# Patient Record
Sex: Male | Born: 1960 | Race: White | Hispanic: No | Marital: Married | Smoking: Light tobacco smoker
Health system: Southern US, Community
[De-identification: ages and names within clinical notes are randomized; demographics above are authoritative.]

---

## 2006-03-03 ENCOUNTER — Ambulatory Visit: Payer: Self-pay | Admitting: Gastroenterology

## 2007-11-26 ENCOUNTER — Ambulatory Visit (HOSPITAL_COMMUNITY): Admission: RE | Admit: 2007-11-26 | Discharge: 2007-11-26 | Payer: Self-pay | Admitting: *Deleted

## 2007-11-26 ENCOUNTER — Encounter (INDEPENDENT_AMBULATORY_CARE_PROVIDER_SITE_OTHER): Payer: Self-pay | Admitting: *Deleted

## 2010-11-30 NOTE — Op Note (Signed)
NAME:  Joe Ferguson, WIEN NO.:  [id number]   MEDICAL RECORD NO.:  [id number]          PATIENT TYPE:  AMB   LOCATION:  ENDO                         FACILITY:  [facility]   PHYSICIAN:  Georgiana Spinner, M.D.    DATE OF BIRTH:  11/19/1960   DATE OF PROCEDURE:  11/26/2007  DATE OF DISCHARGE:                               OPERATIVE REPORT   PROCEDURE:  Upper endoscopy.   INDICATIONS:  GERD with dysphagia.   ANESTHESIA:  Fentanyl 62.5 mcg, Versed 5 mg.   DESCRIPTION OF PROCEDURE:  With the patient mildly sedated in the left  lateral decubitus position, the Pentax videoscopic endoscope was  inserted in the mouth, passed under direct vision through the esophagus  which appeared normal until we reached the distal esophagus and there  was clear-cut area of Barrett's photographed and biopsied.  There were  also some changes of esophagitis with ulceration noted in two locations.  We entered into the stomach.  Fundus, body, antrum, duodenal bulb,  second portion duodenum were visualized.  From this point the endoscope  was slowly withdrawn taking circumferential views of duodenal mucosa  until the endoscope had been pulled back into the stomach, placed in  retroflexion to view the stomach from below.  The endoscope was  straightened and withdrawn taking circumferential views of remaining  gastric and esophageal mucosa.  The patient's vital signs and pulse  oximeter remained stable.  The patient tolerated the procedure well  without apparent complications.   FINDINGS:  Changes of esophagitis and Barrett's esophagus of the distal  esophagus, biopsied.  Await biopsy report.  The patient will call me for  results.  Will start the patient on PPI therapy and he will follow-up  with me as an outpatient.           ______________________________  Georgiana Spinner, M.D.     GMO/MEDQ  D:  11/26/2007  T:  11/26/2007  Job:  [id number]

## 2010-11-30 NOTE — Op Note (Signed)
NAME:  Joe Ferguson, CHRISTMAS NO.:  [id number]   MEDICAL RECORD NO.:  [id number]          PATIENT TYPE:  AMB   LOCATION:  ENDO                         FACILITY:  [facility]   PHYSICIAN:  Georgiana Spinner, M.D.    DATE OF BIRTH:  [birth year]   DATE OF PROCEDURE:  DATE OF DISCHARGE:                               OPERATIVE REPORT   PROCEDURE:  Colonoscopy.   INDICATIONS:  Colon cancer screening.  Family history of colon cancer.   ANESTHESIA:  Fentanyl 37.5 mcg, Versed 3 mg.   DESCRIPTION OF PROCEDURE:  With the patient mildly sedated in the left  lateral decubitus position a rectal examination was performed, which was  unremarkable to my exam.  Subsequently, the Pentax videoscopic  colonoscope was inserted in the rectum and passed under direct vision  with pressure applied to reach the cecum.  The cecum was identified by  the ileocecal valve.  We could never really see the base of the cecum  because the prep was poor.  There was liquid material with solid, what  appeared to be corn in the cecum which I could not fully suctioned.  From this point, the colonoscope was then withdrawn, taking  circumferential views of the colonic mucosa, stopping to suction fecal  debris that was liquid with solid materials along the way until we  reached the rectum which appeared normal on direct and showed  hemorrhoids on retroflexed view.  The endoscope was straightened and  withdrawn.  The patient's vital signs and pulse oximeter remained  stable.  The patient tolerated the procedure well without apparent  complications.   FINDINGS:  Poor prep precludes thorough examination.  No gross lesions  seen.  Some areas not well seen.  Internal hemorrhoids were noted.   PLAN:  Repeat examination in 1 year.           ______________________________  Georgiana Spinner, M.D.     GMO/MEDQ  D:  11/26/2007  T:  11/26/2007  Job:  [id number]

## 2010-12-03 NOTE — Assessment & Plan Note (Signed)
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 Several times during the day for the past few  months.  He has had no change in bowel habits, weight loss, abdominal pain,  change in stool caliber, melena, hematochezia or rectal pain.  His father  has a history of colon cancer arising in a polyp. No other family members  with colon cancer, colon polyps or inflammatory bowel disease.  He states  home stool Hemoccult tests performed within the past few months were  negative. He has no previously had a colonoscopy.   PAST MEDICAL HISTORY:  Status post shoulder surgery in 1998.   CURRENT MEDICATIONS:  Listed on the chart, reviewed.   ALLERGIES:  No known drug allergies.   SOCIAL HISTORY:  He is divorced with one child. 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 He smokes about 5 cigarettes per day and drinks a  modest amount of alcohol on social occasions.   REVIEW OF SYSTEMS:  Entirely negative per the handwritten form.   PHYSICAL EXAMINATION:  GENERAL:  In no acute distress.  VITAL SIGNS:  Height 5 feet, 7 inches. Weight 174.2 pounds. Blood pressure  is 132/84, pulse 60 and regular.  HEENT EXAM:  Anicteric sclerae. Oropharynx clear.  CHEST:  Clear to auscultation bilaterally.  CARDIAC:  Regular rate and rhythm without murmurs appreciated.  ABDOMEN:  Soft, nontender, nondistended, normal active bowel sounds, no  palpable organomegaly, masses or hernias.  RECTAL EXAMINATION:  Deferred until time of  colonoscopy.  EXTREMITIES:  Without clubbing, cyanosis or edema.  NEUROLOGIC:  Alert and oriented x3. Grossly nonfocal.   ASSESSMENT AND PLAN:  First degree relative with colon cancer in his 72's.  Rule out colorectal neoplasms. Risks, benefits and alternatives to  colonoscopy with possible biopsy and  possible polypectomy discussed with the patient. He consents to proceed.  This will be scheduled electively.                                   Venita Lick. Pleas Koch., MD, Clementeen Graham   MTS/MedQ  DD:  03/10/2006  DT:  03/10/2006  Job #:  [id number]

## 2010-12-27 ENCOUNTER — Other Ambulatory Visit: Payer: Self-pay | Admitting: Orthopedic Surgery

## 2010-12-27 DIAGNOSIS — M25511 Pain in right shoulder: Secondary | ICD-10-CM

## 2011-01-03 ENCOUNTER — Ambulatory Visit
Admission: RE | Admit: 2011-01-03 | Discharge: 2011-01-03 | Disposition: A | Discharge status: Home / Self Care | Payer: PRIVATE HEALTH INSURANCE | Source: Ambulatory Visit | Attending: Orthopedic Surgery | Admitting: Orthopedic Surgery

## 2011-01-03 ENCOUNTER — Other Ambulatory Visit: Payer: Self-pay | Admitting: Orthopedic Surgery

## 2011-01-03 DIAGNOSIS — M25512 Pain in left shoulder: Secondary | ICD-10-CM

## 2011-01-03 DIAGNOSIS — M25511 Pain in right shoulder: Secondary | ICD-10-CM

## 2011-01-24 ENCOUNTER — Ambulatory Visit (HOSPITAL_BASED_OUTPATIENT_CLINIC_OR_DEPARTMENT_OTHER)
Admission: RE | Admit: 2011-01-24 | Discharge: 2011-01-24 | Disposition: A | Discharge status: Home / Self Care | Payer: PRIVATE HEALTH INSURANCE | Source: Ambulatory Visit | Attending: Orthopedic Surgery | Admitting: Orthopedic Surgery

## 2011-01-24 DIAGNOSIS — Z01812 Encounter for preprocedural laboratory examination: Secondary | ICD-10-CM | POA: Insufficient documentation

## 2011-01-24 DIAGNOSIS — S43429A Sprain of unspecified rotator cuff capsule, initial encounter: Secondary | ICD-10-CM | POA: Insufficient documentation

## 2011-01-24 DIAGNOSIS — M24119 Other articular cartilage disorders, unspecified shoulder: Secondary | ICD-10-CM | POA: Insufficient documentation

## 2011-01-24 DIAGNOSIS — Z87891 Personal history of nicotine dependence: Secondary | ICD-10-CM | POA: Insufficient documentation

## 2011-01-24 DIAGNOSIS — W19XXXA Unspecified fall, initial encounter: Secondary | ICD-10-CM | POA: Insufficient documentation

## 2011-01-24 DIAGNOSIS — M25819 Other specified joint disorders, unspecified shoulder: Secondary | ICD-10-CM | POA: Insufficient documentation

## 2011-02-08 NOTE — Op Note (Signed)
NAME:  Joe Ferguson, Joe Ferguson NO.:  [id number]  MEDICAL RECORD NO.:  [id number]  LOCATION:                                 FACILITY:  PHYSICIAN:  Jones Broom, MD         DATE OF BIRTH:  DATE OF PROCEDURE:  01/24/2011 DATE OF DISCHARGE:                              OPERATIVE REPORT   PREOPERATIVE DIAGNOSES: 1. Left shoulder rotator cuff tear. 2. Left shoulder impingement.  POSTOPERATIVE DIAGNOSES: 1. Left shoulder supraspinatus tear. 2. Left shoulder impingement. 3. Left shoulder superior and anterior labral tears.  PROCEDURES PERFORMED: 1. Left arthroscopic rotator cuff repair of the supraspinatus tendon. 2. Left arthroscopic subacromial decompression. 3. Left arthroscopic debridement of superior and anterior labral     tears.  ATTENDING SURGEON:  Jones Broom, MD  ASSISTANT:  None.  ANESTHESIA:  GETA with preoperative interscalene block.  COMPLICATIONS:  None.  DRAINS:  None. SPECIMENS:  None.  ESTIMATED BLOOD LOSS:  Minimal.  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  He complained of left shoulder pain and weakness.  He was unable to get an MRI due to claustrophobia, but an ultrasound demonstrated findings of full- thickness rotator cuff tear.  He was indicated for operative treatment to restore strength and function and decrease pain as well as to try and prevent increase in tear size with time.  He understood risks, benefits, and alternatives of the procedure including but not limited to risk of bleeding, infection, damage to neurovascular structures, stiffness, and nonhealing of the tendon.  He understood and elected to go forward with surgery.  OPERATIVE FINDINGS:  Examination under anesthesia demonstrated no laxity or instability.  Diagnostic arthroscopy revealed extensive tearing of the superior labrum with no significant elevation of the biceps  root, but the torn labrum was hanging down into the joint.  The biceps tendon itself was healthy appearing as was the subscapularis.  The anterior labrum was partially torn.  The joint surfaces looked good with no significant arthritis.  There was some synovitis throughout the joint. No loose bodies were noted.  The infraspinatus was intact with some partial-thickness undersurface tearing, but no full-thickness tearing. There was full-thickness tear of the supraspinatus with posterior retraction of the tendon.  The labral tears were debrided back to stable base.  There was extensive hypertrophied bursa in the subacromial space which was debrided.  Once the tear was exposed, it was found to be anterior L-shaped tear retracted posteriorly.  After some minimal releases, the tendon was easily brought back to its anatomic position. It was then repaired using two 5.5 mm BioComposite corkscrew anchors with the posterior anchor in a mattress configuration and the anterior anchor in a simple suture configuration bringing the corner back to the anterior tuberosity.  The repair was felt to be anatomic and under no undue tension.  The coracoacromial ligament was taken down and he was noted to have a moderate-sized anterior acromial spur, which was taken down with a standard acromioplasty.  PROCEDURE:  The patient was identified in the preoperative holding area where I personally marked  the operative site after verifying site, side, and procedure with the patient.  He was taken back to the operating room after interscalene block that was given by the attending anesthesiologist successfully.  He was given general anesthesia and placed in the beach-chair position and all extremities were carefully padded and positioned as well as the head and neck.  The patient did receive IV antibiotics prior to the procedure.  The appropriate time-out procedure was carried out.  The left upper extremity was prepped  and draped in a standard sterile fashion and a standard posterior portal was established.  The arthroscope was introduced into the joint and the anterior portal was then established with needle localization above the subscapularis.  Diagnostic arthroscopy was then carried out with findings as described above.  The shaver was introduced through the anterior portal to debride the anterior and superior labral tears back to a stable base.  The residual labrum was healthy appearing.  The rotator cuff tear was visualized as described above and the undersurface of the scarred tendon edge was debrided from the joint surface.  The arthroscope was then introduced in the subacromial space where he was noted to have extensive hypertrophied bursa which was debrided using the ArthroCare and shaver.  Once the rotator cuff was clearly demarcated on the bursal surface, the tear configuration was noted to be a L-shaped tear which had retracted medially and posteriorly.  After some small releases in the subacromial subdeltoid space posteriorly, the tendon was easily mobilized back to its anatomical origin.  There was noted to besome residual tendon on the far lateral edge of the tuberosity and after this tendon was debrided to expose the tuberosity for repair.  Given the configuration of the repair, I placed one posterior 5.5-mm BioComposite corkscrew anchor at the articular margin passing one suture in a horizontal mattress configuration in the posterior aspect of the tendon. I was able to place these sutures by first passing a traction stitch which was brought out percutaneously to hold the tendon reduced while passing the sutures with the Scorpion suture passer.  The second anchor was then placed off the articular margin in the anterior corner of the repair and each suture was then passed using the Scorpion suture passer in a simple fashion.  The grasper was used through the anterior portal to hold the  tendon reduced in an anatomic position while the sutures from the posterior anchor were tied bringing the tendon down to the prepared tuberosity which was prepared to a bleeding surface.  The anterior sutures were then also tied.  The repair was felt to be anatomic and there was no undue tension on the repair.  It was viewed from lateral and posterior portals.  With the camera in the posterior portal, the coracoacromial ligament was then taken down and he was noted to have a moderate anterior acromial spur which was taken down a standard acromioplasty using a 4-mm bur from lateral portal.  The arthroscopic equipment was then removed from the joint and the portals were closed with 3-0 nylon interrupted fashion.  Sterile dressings were then applied including Xeroform, 4 x 4's, ABDs, and tape.  The small percutaneous anchor sites were closed with Steri-Strips only.  The patient was allowed to awaken from general anesthesia, transferred to stretcher, and taken to the recovery room in stable condition.  POSTOPERATIVE PLAN:  He will be discharged home today and will follow up in 1 week for suture removal and wound check.  In the meantime,  he will remain in a sling.     Jones Broom, MD     JC/MEDQ  D:  01/24/2011  T:  01/25/2011  Job:  [id number]  Electronically Signed by Jones Broom  on 02/08/2011 03:57:26 PM

## 2016-04-30 ENCOUNTER — Emergency Department (HOSPITAL_COMMUNITY)
Admission: EM | Admit: 2016-04-30 | Discharge: 2016-04-30 | Disposition: A | Discharge status: Home / Self Care | Payer: Managed Care, Other (non HMO) | Attending: Emergency Medicine | Admitting: Emergency Medicine

## 2016-04-30 ENCOUNTER — Encounter (HOSPITAL_COMMUNITY): Payer: Self-pay

## 2016-04-30 DIAGNOSIS — Y929 Unspecified place or not applicable: Secondary | ICD-10-CM | POA: Insufficient documentation

## 2016-04-30 DIAGNOSIS — S3022XA Contusion of scrotum and testes, initial encounter: Secondary | ICD-10-CM | POA: Insufficient documentation

## 2016-04-30 DIAGNOSIS — F17290 Nicotine dependence, other tobacco product, uncomplicated: Secondary | ICD-10-CM | POA: Insufficient documentation

## 2016-04-30 DIAGNOSIS — Y9366 Activity, soccer: Secondary | ICD-10-CM | POA: Diagnosis not present

## 2016-04-30 DIAGNOSIS — W501XXA Accidental kick by another person, initial encounter: Secondary | ICD-10-CM | POA: Diagnosis not present

## 2016-04-30 DIAGNOSIS — S3994XA Unspecified injury of external genitals, initial encounter: Secondary | ICD-10-CM | POA: Diagnosis present

## 2016-04-30 DIAGNOSIS — Y999 Unspecified external cause status: Secondary | ICD-10-CM | POA: Diagnosis not present

## 2016-04-30 NOTE — ED Provider Notes (Signed)
[facility]-EMERGENCY DEPT Provider Note   CSN: [id number] Arrival date & time: 04/30/16  1942     History   Chief Complaint Chief Complaint  Patient presents with  . Groin Pain    HPI Joe Ferguson is a 55 y.o. male.  He is here for evaluation of right testicle pain, after being kicked accidentally during a soccer gain. He denies bruising, significant swelling, penile discharge or bleeding. No prior similar problem. There are no other known modifying factors.  HPI  History reviewed. No pertinent past medical history.  There are no active problems to display for this patient.   History reviewed. No pertinent surgical history.     Home Medications    Prior to Admission medications   Not on File    Family History No family history on file.  Social History Social History  Substance Use Topics  . Smoking status: Light Tobacco Smoker    Types: Cigars  . Smokeless tobacco: Never Used     Comment: occansional  . Alcohol use Yes     Allergies   Review of patient's allergies indicates no known allergies.   Review of Systems Review of Systems  All other systems reviewed and are negative.    Physical Exam Updated Vital Signs BP 134/76 (BP Location: Left Arm)   Pulse 100   Temp 97.8 F (36.6 C) (Oral)   Resp 18   Ht 5\' 8"  (1.727 m)   Wt 180 lb (81.6 kg)   SpO2 96%   BMI 27.37 kg/m   Physical Exam  Constitutional: He is oriented to person, place, and time. He appears well-developed and well-nourished. No distress.  HENT:  Head: Normocephalic and atraumatic.  Right Ear: External ear normal.  Left Ear: External ear normal.  Eyes: Conjunctivae and EOM are normal. Pupils are equal, round, and reactive to light.  Neck: Normal range of motion and phonation normal. Neck supple.  Cardiovascular: Normal rate.   Pulmonary/Chest: Effort normal. He exhibits no bony tenderness.  Abdominal: There is tenderness.  Genitourinary: Penis normal.  Genitourinary Comments:  Right testicle slightly tender and minimally enlarged, is compared to left. No scrotal mass or deformity. Right testicle is mobile, and there is no palpable defect.  Musculoskeletal: Normal range of motion.  Neurological: He is alert and oriented to person, place, and time. No cranial nerve deficit or sensory deficit. He exhibits normal muscle tone (.ewed). Coordination normal.  Skin: Skin is warm, dry and intact.  Psychiatric: He has a normal mood and affect. His behavior is normal. Judgment and thought content normal.  Nursing note and vitals reviewed.    ED Treatments / Results  Labs (all labs ordered are listed, but only abnormal results are displayed) Labs Reviewed - No data to display  EKG  EKG Interpretation None       Radiology No results found.  Procedures Procedures (including critical care time)  Medications Ordered in ED Medications - No data to display   Initial Impression / Assessment and Plan / ED Course  I have reviewed the triage vital signs and the nursing notes.  Pertinent labs & imaging results that were available during my care of the patient were reviewed by me and considered in my medical decision making (see chart for details).  Clinical Course    Medications - No data to display  Patient Vitals for the past 24 hrs:  BP Temp Temp src Pulse Resp SpO2 Height Weight  04/30/16 1947 134/76 97.8 F (36.6 C) Oral 100  18 96 % 5\' 8"  (1.727 m) 180 lb (81.6 kg)    9:32 PM Reevaluation with update and discussion. After initial assessment and treatment, an updated evaluation reveals No change in clinical status. Findings discussed with patient and all questions answered. Charisse Wendell L    Final Clinical Impressions(s) / ED Diagnoses   Final diagnoses:  Contusion of testis, initial encounter   Testicle contusion, accidental. I have very low suspicion for significant injury such as torsion, testicular rupture, or injury to the scrotal  contents.  Nursing Notes Reviewed/ Care Coordinated Applicable Imaging Reviewed Interpretation of Laboratory Data incorporated into ED treatment  The patient appears reasonably screened and/or stabilized for discharge and I doubt any other medical condition or other [facility] requiring further screening, evaluation, or treatment in the ED at this time prior to discharge.  Plan: Home Medications- IBU; Home Treatments- rest; return here if the recommended treatment, does not improve the symptoms; Recommended follow up- PCP prn    New Prescriptions New Prescriptions   No medications on file     Mancel Bale, MD 04/30/16 2133

## 2016-04-30 NOTE — Discharge Instructions (Signed)
Take ibuprofen as needed for pain  Rest, and consider scrotal support.
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
## 2018-10-08 ENCOUNTER — Encounter (HOSPITAL_COMMUNITY): Payer: Self-pay | Admitting: Emergency Medicine

## 2018-10-08 ENCOUNTER — Emergency Department (HOSPITAL_COMMUNITY): Payer: Managed Care, Other (non HMO)

## 2018-10-08 ENCOUNTER — Emergency Department (HOSPITAL_COMMUNITY)
Admission: EM | Admit: 2018-10-08 | Discharge: 2018-10-08 | Discharge status: Against Medical Advice | Payer: Managed Care, Other (non HMO) | Attending: Emergency Medicine | Admitting: Emergency Medicine

## 2018-10-08 ENCOUNTER — Other Ambulatory Visit: Payer: Self-pay

## 2018-10-08 DIAGNOSIS — S0083XA Contusion of other part of head, initial encounter: Secondary | ICD-10-CM | POA: Diagnosis not present

## 2018-10-08 DIAGNOSIS — Y998 Other external cause status: Secondary | ICD-10-CM | POA: Diagnosis not present

## 2018-10-08 DIAGNOSIS — R079 Chest pain, unspecified: Secondary | ICD-10-CM | POA: Insufficient documentation

## 2018-10-08 DIAGNOSIS — R51 Headache: Secondary | ICD-10-CM | POA: Diagnosis not present

## 2018-10-08 DIAGNOSIS — F17290 Nicotine dependence, other tobacco product, uncomplicated: Secondary | ICD-10-CM | POA: Insufficient documentation

## 2018-10-08 DIAGNOSIS — Y929 Unspecified place or not applicable: Secondary | ICD-10-CM | POA: Diagnosis not present

## 2018-10-08 DIAGNOSIS — Y939 Activity, unspecified: Secondary | ICD-10-CM | POA: Diagnosis not present

## 2018-10-08 LAB — CBC WITH DIFFERENTIAL/PLATELET
Abs Immature Granulocytes: 0.02 10*3/uL (ref 0.00–0.07)
Basophils Absolute: 0 10*3/uL (ref 0.0–0.1)
Basophils Relative: 1 %
Eosinophils Absolute: 0.1 10*3/uL (ref 0.0–0.5)
Eosinophils Relative: 1 %
HCT: 43 % (ref 39.0–52.0)
Hemoglobin: 14.8 g/dL (ref 13.0–17.0)
Immature Granulocytes: 0 %
Lymphocytes Relative: 27 %
Lymphs Abs: 1.7 10*3/uL (ref 0.7–4.0)
MCH: 33.6 pg (ref 26.0–34.0)
MCHC: 34.4 g/dL (ref 30.0–36.0)
MCV: 97.7 fL (ref 80.0–100.0)
Monocytes Absolute: 0.6 10*3/uL (ref 0.1–1.0)
Monocytes Relative: 9 %
Neutro Abs: 3.9 10*3/uL (ref 1.7–7.7)
Neutrophils Relative %: 62 %
Platelets: 214 10*3/uL (ref 150–400)
RBC: 4.4 MIL/uL (ref 4.22–5.81)
RDW: 13.2 % (ref 11.5–15.5)
WBC: 6.3 10*3/uL (ref 4.0–10.5)
nRBC: 0 % (ref 0.0–0.2)

## 2018-10-08 LAB — COMPREHENSIVE METABOLIC PANEL
ALT: 45 U/L — ABNORMAL HIGH (ref 0–44)
AST: 78 U/L — ABNORMAL HIGH (ref 15–41)
Albumin: 3.8 g/dL (ref 3.5–5.0)
Alkaline Phosphatase: 74 U/L (ref 38–126)
Anion gap: 18 — ABNORMAL HIGH (ref 5–15)
BUN: 9 mg/dL (ref 6–20)
CO2: 16 mmol/L — ABNORMAL LOW (ref 22–32)
Calcium: 8 mg/dL — ABNORMAL LOW (ref 8.9–10.3)
Chloride: 100 mmol/L (ref 98–111)
Creatinine, Ser: 0.85 mg/dL (ref 0.61–1.24)
GFR calc Af Amer: >60 mL/min (ref >60)
GFR calc non Af Amer: >60 mL/min (ref >60)
Glucose, Bld: 76 mg/dL (ref 70–99)
Potassium: 3.9 mmol/L (ref 3.5–5.1)
Sodium: 134 mmol/L — ABNORMAL LOW (ref 135–145)
Total Bilirubin: 0.4 mg/dL (ref 0.3–1.2)
Total Protein: 6.9 g/dL (ref 6.5–8.1)

## 2018-10-08 LAB — D-DIMER, QUANTITATIVE: D-Dimer, Quant: 1.47 ug/mL-FEU — ABNORMAL HIGH (ref 0.00–0.50)

## 2018-10-08 LAB — TROPONIN I: Troponin I: <0.03 ng/mL (ref <0.03)

## 2018-10-08 MED ORDER — SODIUM CHLORIDE 0.9 % IV BOLUS
1000.0000 mL | Freq: Once | INTRAVENOUS | Status: AC
  Administered 2018-10-08: 1000 mL via INTRAVENOUS

## 2018-10-08 NOTE — ED Notes (Signed)
Xray team called to say that patient removed his IV, and heart monitor, stating he is upset and wants to leave.

## 2018-10-08 NOTE — ED Notes (Signed)
Patient removed his IV and heart monitor. He states he does not remember removing them, stating "I think the doctor did it to proper assess me." Another IV placed

## 2018-10-08 NOTE — ED Notes (Signed)
Patient returned to room, removed the second IV, put his clothes on and said "I want to leave, I dont like hospitals." He is encouraged to wait for the provider, patient states "i'll sign AMA, GET ME OUT OF HERE."
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
## 2018-10-08 NOTE — ED Notes (Addendum)
Father  Abdurraheem Afifi contact information is [phone]) for updates

## 2018-10-08 NOTE — Discharge Instructions (Addendum)
Follow-up with your PCP.

## 2018-12-18 ENCOUNTER — Ambulatory Visit: Payer: 59 | Admitting: Psychology

## 2018-12-24 ENCOUNTER — Ambulatory Visit: Payer: 59 | Admitting: Psychology

## 2019-01-04 ENCOUNTER — Ambulatory Visit: Payer: 59 | Admitting: Psychology

## 2019-01-15 ENCOUNTER — Ambulatory Visit: Payer: 59 | Admitting: Psychology

## 2019-02-14 ENCOUNTER — Ambulatory Visit: Payer: 59 | Admitting: Psychology

## 2019-02-15 ENCOUNTER — Ambulatory Visit: Payer: Self-pay | Admitting: Surgery

## 2019-02-15 NOTE — H&P (Signed)
History of Present Illness Joe Ferguson(Joe Ferguson; 02/15/2019 8:09 PM) The patient is a 58 year old male who presents with an inguinal hernia. 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 He had a left inguinal hernia repaired with mesh in 2001 and was told he had a sliding hernia containing sigmoid colon. He works out vigorously and this pain has been limiting his activity. No GI obstructive symptoms. He has had a small asymptomatic umbilical hernia for about a year and has noticed an upper midline rectus diastasis.  He would like to have his surgery on a Friday to limit his time out of work.   Problem List/Past Medical Joe Ferguson(Joe Blanford K. Jahon Bart, Ferguson; 02/15/2019 8:10 PM) INGUINAL HERNIA OF RIGHT SIDE WITHOUT OBSTRUCTION OR GANGRENE (K40.90)  Past Surgical History (Joe Ferguson, Joe Ferguson; 02/15/2019 11:36 AM) Open Inguinal Hernia Surgery Left. Shoulder Surgery Bilateral.  Diagnostic Studies History (Joe Ferguson, Joe Ferguson; 02/15/2019 11:36 AM) Colonoscopy 5-10 years ago  Allergies (Joe Ferguson, Joe Ferguson; 02/15/2019 11:36 AM) No Known Drug Allergies [02/15/2019]: Allergies Reconciled  Medication History (Joe Ferguson, Joe Ferguson; 02/15/2019 11:37 AM) Sildenafil Citrate (20MG  Tablet, Oral) Active. Otezla (30MG  Tablet, Oral) Active. Medications Reconciled  Other Problems Joe Ferguson(Joe Bodenheimer K. Leandro Berkowitz, Ferguson; 02/15/2019 8:10 PM) Psoriasis     Review of Systems (Joe A. Brown Joe Ferguson; 02/15/2019 11:36 AM) General Not Present- Appetite Loss, Chills, Fatigue, Fever, Night Sweats, Weight Gain and Weight Loss. Skin Present- Rash. Not Present- Change in Wart/Mole, Dryness, Hives, Jaundice, New Lesions, Non-Healing Wounds and Ulcer. HEENT Not Present- Earache, Hearing Loss, Hoarseness, Nose Bleed, Oral Ulcers, Ringing in the Ears, Seasonal Allergies, Sinus Pain, Sore Throat, Visual Disturbances,  Wears glasses/contact lenses and Yellow Eyes. Respiratory Not Present- Bloody sputum, Chronic Cough, Difficulty Breathing, Snoring and Wheezing. Breast Not Present- Breast Mass, Breast Pain, Nipple Discharge and Skin Changes. Cardiovascular Not Present- Chest Pain, Difficulty Breathing Lying Down, Leg Cramps, Palpitations, Rapid Heart Rate, Shortness of Breath and Swelling of Extremities. Gastrointestinal Not Present- Abdominal Pain, Bloating, Bloody Stool, Change in Bowel Habits, Chronic diarrhea, Constipation, Difficulty Swallowing, Excessive gas, Gets full quickly at meals, Hemorrhoids, Indigestion, Nausea, Rectal Pain and Vomiting. Male Genitourinary Not Present- Blood in Urine, Change in Urinary Stream, Frequency, Impotence, Nocturia, Painful Urination, Urgency and Urine Leakage. Musculoskeletal Not Present- Back Pain, Joint Pain, Joint Stiffness, Muscle Pain, Muscle Weakness and Swelling of Extremities. Neurological Not Present- Decreased Memory, Fainting, Headaches, Numbness, Seizures, Tingling, Tremor, Trouble walking and Weakness. Psychiatric Not Present- Anxiety, Bipolar, Change in Sleep Pattern, Depression, Fearful and Frequent crying. Endocrine Not Present- Cold Intolerance, Excessive Hunger, Hair Changes, Heat Intolerance, Hot flashes and New Diabetes. Hematology Not Present- Blood Thinners, Easy Bruising, Excessive bleeding, Gland problems, HIV and Persistent Infections.  Vitals (Joe A. Brown Joe Ferguson; 02/15/2019 11:36 AM) 02/15/2019 11:36 AM Weight: 163.4 lb Height: 66in Body Surface Area: 1.84 m Body Mass Index: 26.37 kg/m  Temp.: 98.74F  Pulse: 100 (Regular)  BP: 134/82 (Sitting, Left Arm, Standard)        Physical Exam Joe Ferguson(Joe Ferguson; 02/15/2019 8:10 PM)  The physical exam findings are as follows: Note:WDWN in NAD Eyes: Pupils equal, round; sclera anicteric HENT: Oral mucosa moist; good dentition Neck: No masses palpated, no thyromegaly Lungs: CTA  bilaterally; normal respiratory effort CV: Regular rate and rhythm; no murmurs; extremities well-perfused with no edema Abd: +bowel sounds, soft, non-tender, no palpable organomegaly; small upper  midline rectus diastasis; small easily reducible asymptomatic umbilical hernia GU: bilateral descended testes; no testicular masses; healed LIH scar with no sign of recurrent hernia; small reducible right inguinal hernia Skin: Warm, dry; no sign of jaundice Psychiatric - alert and oriented x 4; calm mood and affect    Assessment & Plan Joe Ferguson; 02/15/2019 12:02 PM)  INGUINAL HERNIA OF RIGHT SIDE WITHOUT OBSTRUCTION OR GANGRENE (K40.90)  Current Plans Schedule for Surgery - Right inguinal hernia repair with mesh. The surgical procedure has been discussed with the patient. Potential risks, benefits, alternative treatments, and expected outcomes have been explained. All of the patient's questions at this time have been answered. The likelihood of reaching the patient's treatment goal is good. The patient understand the proposed surgical procedure and wishes to proceed.  Joe Burn. Georgette Dover, Ferguson, [place] [facility] [phone]  02/15/2019 8:11 PM

## 2019-08-13 DIAGNOSIS — F4325 Adjustment disorder with mixed disturbance of emotions and conduct: Secondary | ICD-10-CM | POA: Diagnosis not present

## 2019-08-27 DIAGNOSIS — F4325 Adjustment disorder with mixed disturbance of emotions and conduct: Secondary | ICD-10-CM | POA: Diagnosis not present

## 2019-09-17 DIAGNOSIS — F4325 Adjustment disorder with mixed disturbance of emotions and conduct: Secondary | ICD-10-CM | POA: Diagnosis not present

## 2019-09-24 DIAGNOSIS — F438 Other reactions to severe stress: Secondary | ICD-10-CM | POA: Diagnosis not present

## 2019-10-08 DIAGNOSIS — F4320 Adjustment disorder, unspecified: Secondary | ICD-10-CM | POA: Diagnosis not present

## 2019-10-30 DIAGNOSIS — F4320 Adjustment disorder, unspecified: Secondary | ICD-10-CM | POA: Diagnosis not present

## 2019-11-14 DIAGNOSIS — L814 Other melanin hyperpigmentation: Secondary | ICD-10-CM | POA: Diagnosis not present

## 2019-11-14 DIAGNOSIS — D1801 Hemangioma of skin and subcutaneous tissue: Secondary | ICD-10-CM | POA: Diagnosis not present

## 2019-11-14 DIAGNOSIS — D225 Melanocytic nevi of trunk: Secondary | ICD-10-CM | POA: Diagnosis not present

## 2019-11-14 DIAGNOSIS — D485 Neoplasm of uncertain behavior of skin: Secondary | ICD-10-CM | POA: Diagnosis not present

## 2019-11-26 DIAGNOSIS — F4320 Adjustment disorder, unspecified: Secondary | ICD-10-CM | POA: Diagnosis not present
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
## 2020-01-10 ENCOUNTER — Other Ambulatory Visit: Payer: Self-pay | Admitting: *Deleted

## 2020-01-10 ENCOUNTER — Ambulatory Visit
Admission: RE | Admit: 2020-01-10 | Discharge: 2020-01-10 | Disposition: A | Discharge status: Home / Self Care | Payer: No Typology Code available for payment source | Source: Ambulatory Visit | Attending: *Deleted | Admitting: *Deleted

## 2020-01-10 DIAGNOSIS — Z9289 Personal history of other medical treatment: Secondary | ICD-10-CM

## 2020-03-31 DIAGNOSIS — F4320 Adjustment disorder, unspecified: Secondary | ICD-10-CM | POA: Diagnosis not present

## 2020-03-31 DIAGNOSIS — F1020 Alcohol dependence, uncomplicated: Secondary | ICD-10-CM | POA: Diagnosis not present

## 2020-03-31 DIAGNOSIS — F339 Major depressive disorder, recurrent, unspecified: Secondary | ICD-10-CM | POA: Diagnosis not present

## 2020-04-22 DIAGNOSIS — F1020 Alcohol dependence, uncomplicated: Secondary | ICD-10-CM | POA: Diagnosis not present

## 2020-04-22 DIAGNOSIS — F339 Major depressive disorder, recurrent, unspecified: Secondary | ICD-10-CM | POA: Diagnosis not present

## 2020-04-22 DIAGNOSIS — F4320 Adjustment disorder, unspecified: Secondary | ICD-10-CM | POA: Diagnosis not present

## 2020-04-30 DIAGNOSIS — S62304A Unspecified fracture of fourth metacarpal bone, right hand, initial encounter for closed fracture: Secondary | ICD-10-CM | POA: Diagnosis not present

## 2020-05-12 DIAGNOSIS — F4320 Adjustment disorder, unspecified: Secondary | ICD-10-CM | POA: Diagnosis not present

## 2020-05-12 DIAGNOSIS — F1020 Alcohol dependence, uncomplicated: Secondary | ICD-10-CM | POA: Diagnosis not present

## 2020-05-12 DIAGNOSIS — F339 Major depressive disorder, recurrent, unspecified: Secondary | ICD-10-CM | POA: Diagnosis not present

## 2020-05-21 DIAGNOSIS — S62304A Unspecified fracture of fourth metacarpal bone, right hand, initial encounter for closed fracture: Secondary | ICD-10-CM | POA: Diagnosis not present

## 2020-06-09 DIAGNOSIS — F4320 Adjustment disorder, unspecified: Secondary | ICD-10-CM | POA: Diagnosis not present

## 2020-06-09 DIAGNOSIS — F1020 Alcohol dependence, uncomplicated: Secondary | ICD-10-CM | POA: Diagnosis not present

## 2020-06-09 DIAGNOSIS — F339 Major depressive disorder, recurrent, unspecified: Secondary | ICD-10-CM | POA: Diagnosis not present

## 2020-07-07 DIAGNOSIS — F1020 Alcohol dependence, uncomplicated: Secondary | ICD-10-CM | POA: Diagnosis not present

## 2020-07-07 DIAGNOSIS — F4320 Adjustment disorder, unspecified: Secondary | ICD-10-CM | POA: Diagnosis not present

## 2020-07-07 DIAGNOSIS — F339 Major depressive disorder, recurrent, unspecified: Secondary | ICD-10-CM | POA: Diagnosis not present

## 2020-08-04 DIAGNOSIS — F1020 Alcohol dependence, uncomplicated: Secondary | ICD-10-CM | POA: Diagnosis not present

## 2020-08-04 DIAGNOSIS — F4320 Adjustment disorder, unspecified: Secondary | ICD-10-CM | POA: Diagnosis not present

## 2020-08-04 DIAGNOSIS — F339 Major depressive disorder, recurrent, unspecified: Secondary | ICD-10-CM | POA: Diagnosis not present

## 2020-08-21 DIAGNOSIS — Z8249 Family history of ischemic heart disease and other diseases of the circulatory system: Secondary | ICD-10-CM | POA: Diagnosis not present

## 2020-08-21 DIAGNOSIS — Z125 Encounter for screening for malignant neoplasm of prostate: Secondary | ICD-10-CM | POA: Diagnosis not present

## 2020-08-21 DIAGNOSIS — Z79899 Other long term (current) drug therapy: Secondary | ICD-10-CM | POA: Diagnosis not present

## 2020-08-21 DIAGNOSIS — Z0000 Encounter for general adult medical examination without abnormal findings: Secondary | ICD-10-CM | POA: Diagnosis not present

## 2020-08-24 DIAGNOSIS — N529 Male erectile dysfunction, unspecified: Secondary | ICD-10-CM | POA: Diagnosis not present

## 2020-08-24 DIAGNOSIS — R7611 Nonspecific reaction to tuberculin skin test without active tuberculosis: Secondary | ICD-10-CM | POA: Diagnosis not present

## 2020-08-24 DIAGNOSIS — L408 Other psoriasis: Secondary | ICD-10-CM | POA: Diagnosis not present

## 2020-08-24 DIAGNOSIS — Z0000 Encounter for general adult medical examination without abnormal findings: Secondary | ICD-10-CM | POA: Diagnosis not present

## 2020-08-24 DIAGNOSIS — M79642 Pain in left hand: Secondary | ICD-10-CM | POA: Diagnosis not present

## 2020-08-24 DIAGNOSIS — Z23 Encounter for immunization: Secondary | ICD-10-CM | POA: Diagnosis not present

## 2020-08-26 DIAGNOSIS — M6530 Trigger finger, unspecified finger: Secondary | ICD-10-CM | POA: Diagnosis not present

## 2020-08-26 DIAGNOSIS — M7989 Other specified soft tissue disorders: Secondary | ICD-10-CM | POA: Diagnosis not present

## 2020-08-26 DIAGNOSIS — M79645 Pain in left finger(s): Secondary | ICD-10-CM | POA: Diagnosis not present

## 2020-08-26 DIAGNOSIS — R000 Tachycardia, unspecified: Secondary | ICD-10-CM | POA: Diagnosis not present

## 2020-08-26 DIAGNOSIS — Z131 Encounter for screening for diabetes mellitus: Secondary | ICD-10-CM | POA: Diagnosis not present

## 2020-08-26 DIAGNOSIS — M65842 Other synovitis and tenosynovitis, left hand: Secondary | ICD-10-CM | POA: Diagnosis not present

## 2020-08-28 DIAGNOSIS — Z1211 Encounter for screening for malignant neoplasm of colon: Secondary | ICD-10-CM | POA: Diagnosis not present

## 2020-08-28 DIAGNOSIS — Z1212 Encounter for screening for malignant neoplasm of rectum: Secondary | ICD-10-CM | POA: Diagnosis not present

## 2020-09-01 DIAGNOSIS — F1020 Alcohol dependence, uncomplicated: Secondary | ICD-10-CM | POA: Diagnosis not present

## 2020-09-01 DIAGNOSIS — F339 Major depressive disorder, recurrent, unspecified: Secondary | ICD-10-CM | POA: Diagnosis not present

## 2020-09-01 DIAGNOSIS — F4320 Adjustment disorder, unspecified: Secondary | ICD-10-CM | POA: Diagnosis not present

## 2020-09-02 LAB — COLOGUARD: COLOGUARD: NEGATIVE

## 2020-09-02 LAB — EXTERNAL GENERIC LAB PROCEDURE: COLOGUARD: NEGATIVE

## 2020-09-04 DIAGNOSIS — M2550 Pain in unspecified joint: Secondary | ICD-10-CM | POA: Diagnosis not present

## 2020-09-22 DIAGNOSIS — F1020 Alcohol dependence, uncomplicated: Secondary | ICD-10-CM | POA: Diagnosis not present

## 2020-09-22 DIAGNOSIS — F4320 Adjustment disorder, unspecified: Secondary | ICD-10-CM | POA: Diagnosis not present

## 2020-09-22 DIAGNOSIS — F339 Major depressive disorder, recurrent, unspecified: Secondary | ICD-10-CM | POA: Diagnosis not present

## 2020-11-19 DIAGNOSIS — D225 Melanocytic nevi of trunk: Secondary | ICD-10-CM | POA: Diagnosis not present

## 2020-11-19 DIAGNOSIS — D2262 Melanocytic nevi of left upper limb, including shoulder: Secondary | ICD-10-CM | POA: Diagnosis not present

## 2020-11-19 DIAGNOSIS — D2261 Melanocytic nevi of right upper limb, including shoulder: Secondary | ICD-10-CM | POA: Diagnosis not present

## 2020-11-19 DIAGNOSIS — B079 Viral wart, unspecified: Secondary | ICD-10-CM | POA: Diagnosis not present

## 2020-11-27 DIAGNOSIS — Z23 Encounter for immunization: Secondary | ICD-10-CM | POA: Diagnosis not present

## 2021-01-28 DIAGNOSIS — M25561 Pain in right knee: Secondary | ICD-10-CM | POA: Diagnosis not present

## 2021-03-07 IMAGING — CR DG CHEST 2V
2 series · 2 of 2 positions shown · non-contrast
Comparison: Chest radiograph 10/08/2018

CLINICAL DATA: Positive PPD. Smoker.

EXAM:
CHEST - 2 VIEW

[w chest pa]
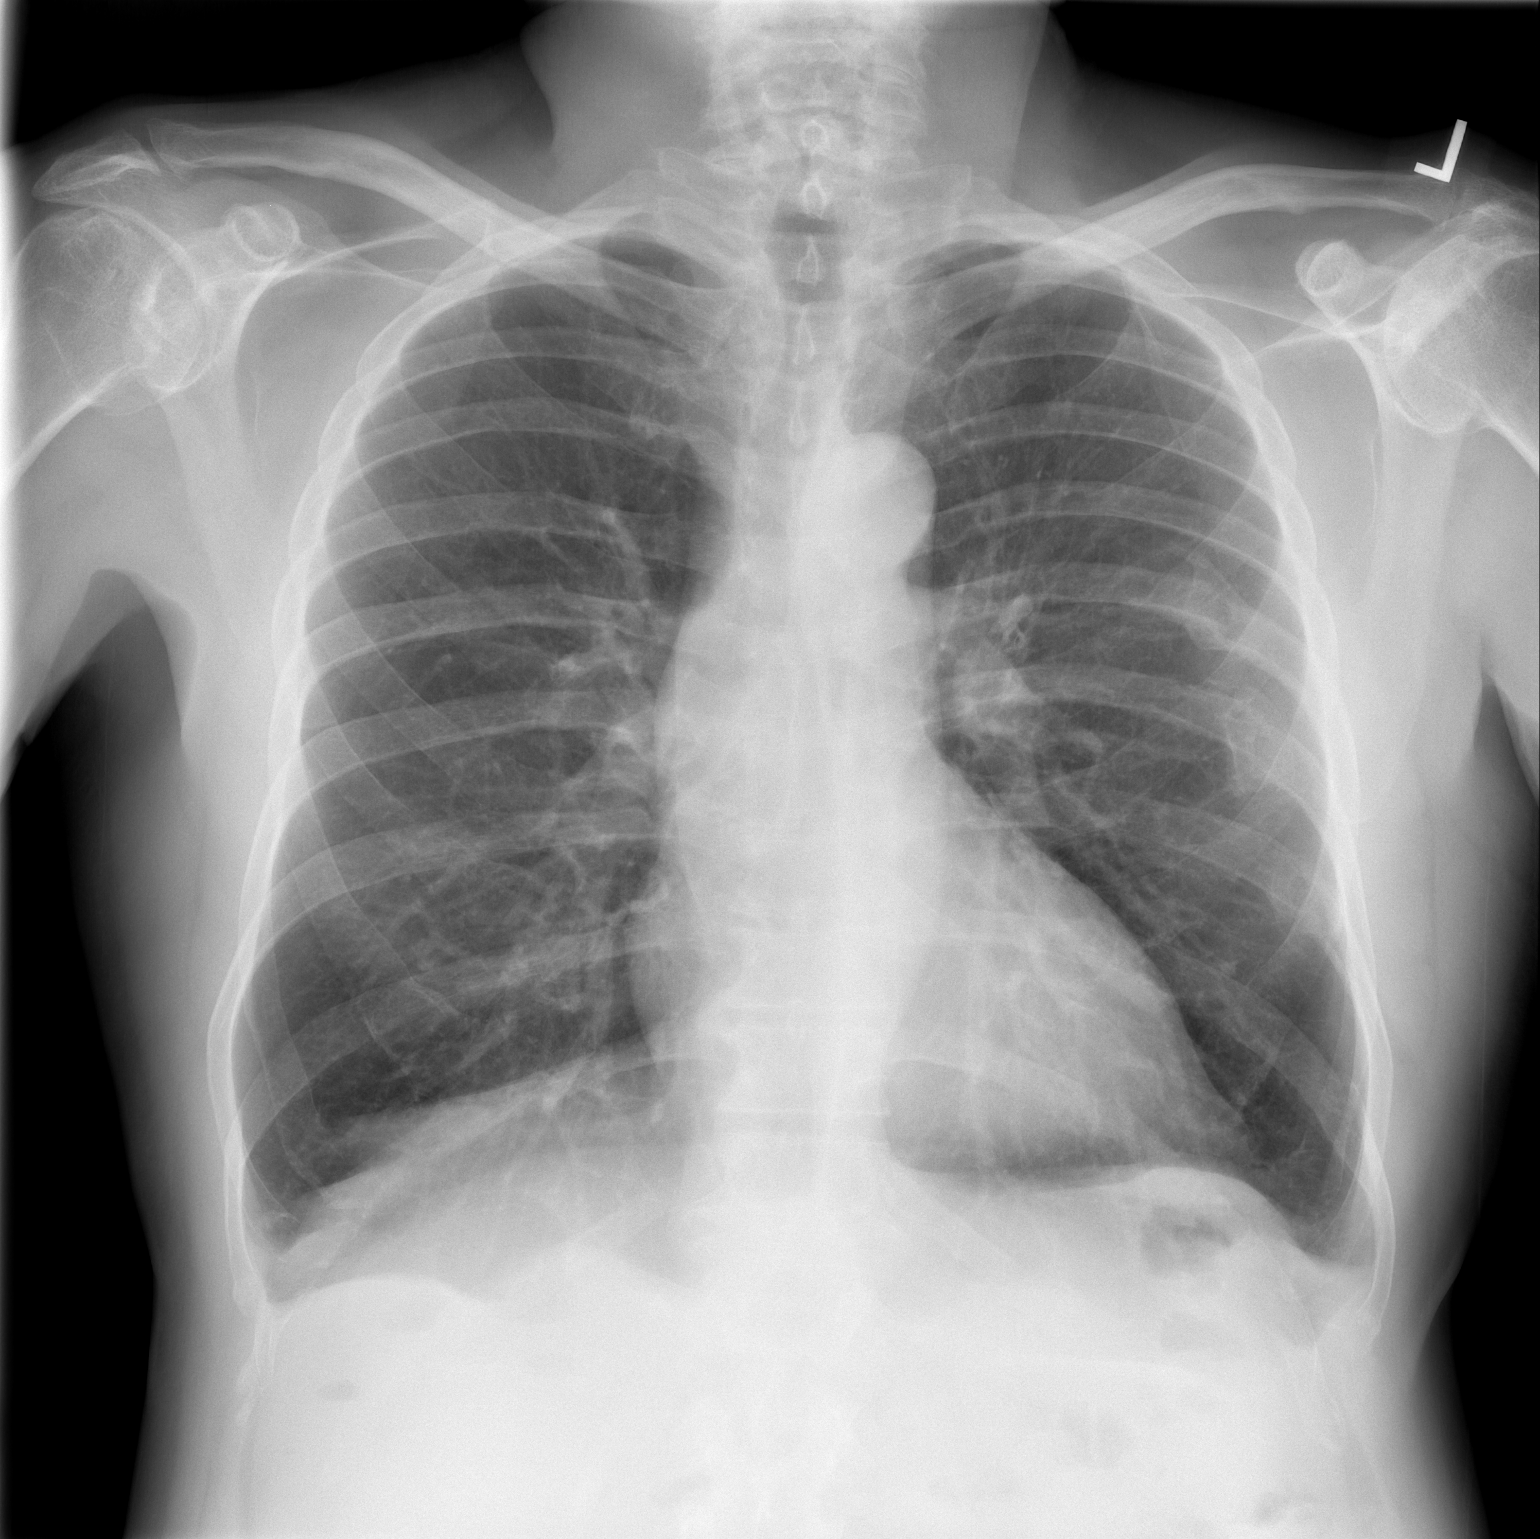

[w chest lat]
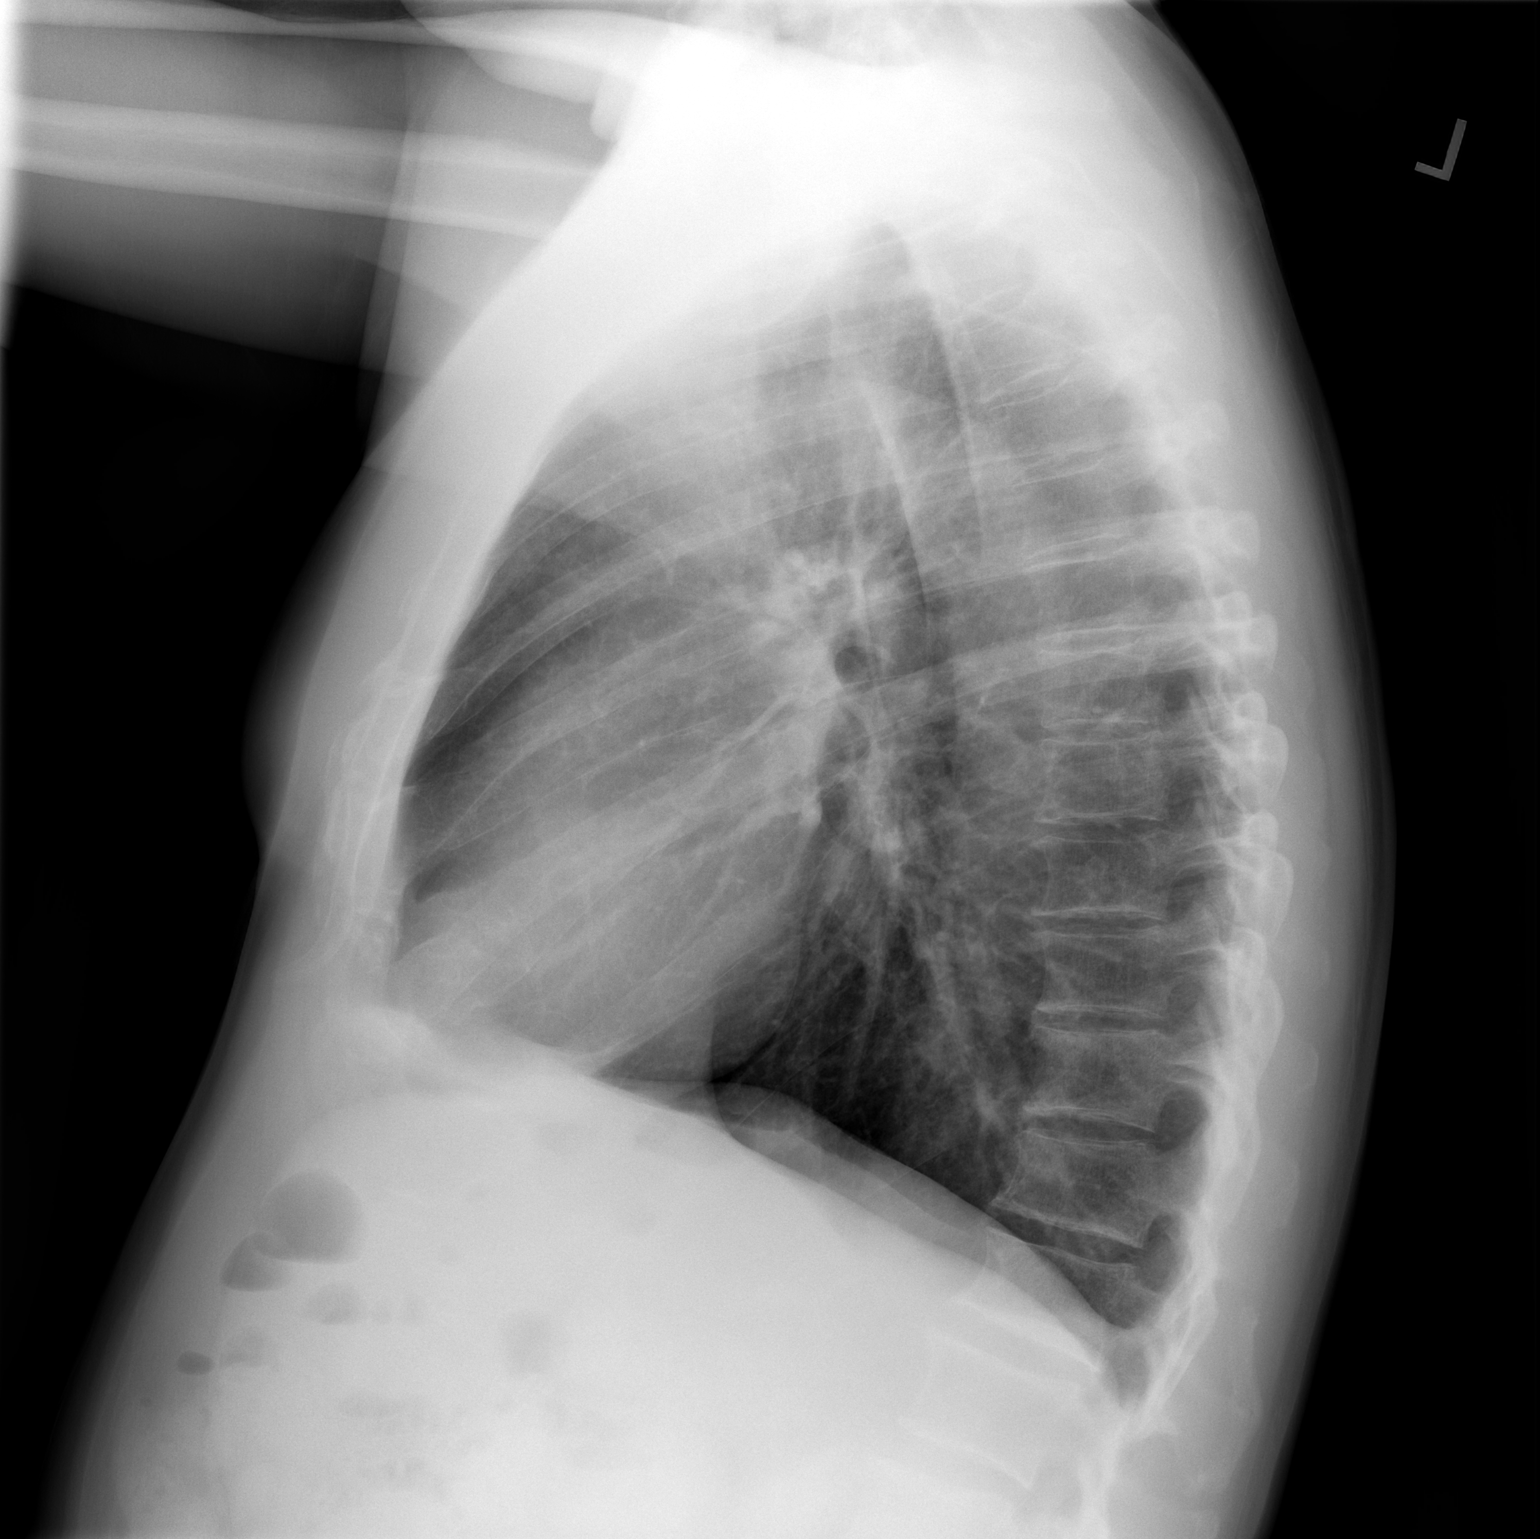

[2 of 2 positions shown; findings below may reference images not displayed]

FINDINGS: Chronic hyperinflation.The cardiomediastinal contours are normal.
Pulmonary vasculature is normal. No consolidation, pleural effusion,
or pneumothorax. Remote left rib fractures. No acute osseous
abnormalities are seen.
IMPRESSION: Chronic hyperinflation without acute abnormality. No radiographic
evidence of tuberculosis.

## 2021-05-11 DIAGNOSIS — F339 Major depressive disorder, recurrent, unspecified: Secondary | ICD-10-CM | POA: Diagnosis not present

## 2021-05-11 DIAGNOSIS — F1020 Alcohol dependence, uncomplicated: Secondary | ICD-10-CM | POA: Diagnosis not present

## 2021-05-11 DIAGNOSIS — F4320 Adjustment disorder, unspecified: Secondary | ICD-10-CM | POA: Diagnosis not present

## 2021-05-25 DIAGNOSIS — F339 Major depressive disorder, recurrent, unspecified: Secondary | ICD-10-CM | POA: Diagnosis not present

## 2021-05-25 DIAGNOSIS — F4320 Adjustment disorder, unspecified: Secondary | ICD-10-CM | POA: Diagnosis not present

## 2021-05-25 DIAGNOSIS — F1020 Alcohol dependence, uncomplicated: Secondary | ICD-10-CM | POA: Diagnosis not present

## 2021-06-04 DIAGNOSIS — H5203 Hypermetropia, bilateral: Secondary | ICD-10-CM | POA: Diagnosis not present

## 2021-06-04 DIAGNOSIS — H25012 Cortical age-related cataract, left eye: Secondary | ICD-10-CM | POA: Diagnosis not present

## 2021-06-04 DIAGNOSIS — H40013 Open angle with borderline findings, low risk, bilateral: Secondary | ICD-10-CM | POA: Diagnosis not present

## 2021-06-04 DIAGNOSIS — H43391 Other vitreous opacities, right eye: Secondary | ICD-10-CM | POA: Diagnosis not present

## 2021-06-04 DIAGNOSIS — H2512 Age-related nuclear cataract, left eye: Secondary | ICD-10-CM | POA: Diagnosis not present

## 2021-06-08 DIAGNOSIS — F339 Major depressive disorder, recurrent, unspecified: Secondary | ICD-10-CM | POA: Diagnosis not present

## 2021-06-08 DIAGNOSIS — F4320 Adjustment disorder, unspecified: Secondary | ICD-10-CM | POA: Diagnosis not present

## 2021-06-08 DIAGNOSIS — F1020 Alcohol dependence, uncomplicated: Secondary | ICD-10-CM | POA: Diagnosis not present

## 2021-06-29 DIAGNOSIS — F1020 Alcohol dependence, uncomplicated: Secondary | ICD-10-CM | POA: Diagnosis not present

## 2021-06-29 DIAGNOSIS — F339 Major depressive disorder, recurrent, unspecified: Secondary | ICD-10-CM | POA: Diagnosis not present

## 2021-06-29 DIAGNOSIS — F4320 Adjustment disorder, unspecified: Secondary | ICD-10-CM | POA: Diagnosis not present

## 2021-08-03 DIAGNOSIS — F339 Major depressive disorder, recurrent, unspecified: Secondary | ICD-10-CM | POA: Diagnosis not present

## 2021-08-03 DIAGNOSIS — F1020 Alcohol dependence, uncomplicated: Secondary | ICD-10-CM | POA: Diagnosis not present

## 2021-08-03 DIAGNOSIS — F4320 Adjustment disorder, unspecified: Secondary | ICD-10-CM | POA: Diagnosis not present

## 2021-08-26 DIAGNOSIS — N529 Male erectile dysfunction, unspecified: Secondary | ICD-10-CM | POA: Diagnosis not present

## 2021-08-26 DIAGNOSIS — Z0000 Encounter for general adult medical examination without abnormal findings: Secondary | ICD-10-CM | POA: Diagnosis not present

## 2021-08-26 DIAGNOSIS — Z125 Encounter for screening for malignant neoplasm of prostate: Secondary | ICD-10-CM | POA: Diagnosis not present

## 2021-08-30 DIAGNOSIS — Z8249 Family history of ischemic heart disease and other diseases of the circulatory system: Secondary | ICD-10-CM | POA: Diagnosis not present

## 2021-08-30 DIAGNOSIS — R7611 Nonspecific reaction to tuberculin skin test without active tuberculosis: Secondary | ICD-10-CM | POA: Diagnosis not present

## 2021-08-30 DIAGNOSIS — Z0000 Encounter for general adult medical examination without abnormal findings: Secondary | ICD-10-CM | POA: Diagnosis not present

## 2021-08-31 DIAGNOSIS — F339 Major depressive disorder, recurrent, unspecified: Secondary | ICD-10-CM | POA: Diagnosis not present

## 2021-08-31 DIAGNOSIS — F1020 Alcohol dependence, uncomplicated: Secondary | ICD-10-CM | POA: Diagnosis not present

## 2021-09-28 DIAGNOSIS — F1020 Alcohol dependence, uncomplicated: Secondary | ICD-10-CM | POA: Diagnosis not present

## 2021-09-28 DIAGNOSIS — F339 Major depressive disorder, recurrent, unspecified: Secondary | ICD-10-CM | POA: Diagnosis not present

## 2021-09-28 DIAGNOSIS — F4320 Adjustment disorder, unspecified: Secondary | ICD-10-CM | POA: Diagnosis not present

## 2021-12-09 DIAGNOSIS — F4320 Adjustment disorder, unspecified: Secondary | ICD-10-CM | POA: Diagnosis not present

## 2021-12-09 DIAGNOSIS — F339 Major depressive disorder, recurrent, unspecified: Secondary | ICD-10-CM | POA: Diagnosis not present

## 2021-12-09 DIAGNOSIS — F1020 Alcohol dependence, uncomplicated: Secondary | ICD-10-CM | POA: Diagnosis not present

## 2022-03-09 DIAGNOSIS — F1020 Alcohol dependence, uncomplicated: Secondary | ICD-10-CM | POA: Diagnosis not present

## 2022-03-09 DIAGNOSIS — F339 Major depressive disorder, recurrent, unspecified: Secondary | ICD-10-CM | POA: Diagnosis not present

## 2022-03-09 DIAGNOSIS — F4320 Adjustment disorder, unspecified: Secondary | ICD-10-CM | POA: Diagnosis not present

## 2022-04-19 DIAGNOSIS — F1020 Alcohol dependence, uncomplicated: Secondary | ICD-10-CM | POA: Diagnosis not present

## 2022-04-19 DIAGNOSIS — F339 Major depressive disorder, recurrent, unspecified: Secondary | ICD-10-CM | POA: Diagnosis not present

## 2022-04-19 DIAGNOSIS — F4320 Adjustment disorder, unspecified: Secondary | ICD-10-CM | POA: Diagnosis not present

## 2022-05-17 DIAGNOSIS — F4320 Adjustment disorder, unspecified: Secondary | ICD-10-CM | POA: Diagnosis not present

## 2022-05-17 DIAGNOSIS — F339 Major depressive disorder, recurrent, unspecified: Secondary | ICD-10-CM | POA: Diagnosis not present

## 2022-05-17 DIAGNOSIS — F1020 Alcohol dependence, uncomplicated: Secondary | ICD-10-CM | POA: Diagnosis not present

## 2022-06-21 DIAGNOSIS — F4320 Adjustment disorder, unspecified: Secondary | ICD-10-CM | POA: Diagnosis not present

## 2022-06-21 DIAGNOSIS — F1020 Alcohol dependence, uncomplicated: Secondary | ICD-10-CM | POA: Diagnosis not present

## 2022-06-21 DIAGNOSIS — F339 Major depressive disorder, recurrent, unspecified: Secondary | ICD-10-CM | POA: Diagnosis not present

## 2022-09-06 ENCOUNTER — Other Ambulatory Visit (HOSPITAL_BASED_OUTPATIENT_CLINIC_OR_DEPARTMENT_OTHER): Payer: Self-pay | Admitting: Internal Medicine

## 2022-09-06 DIAGNOSIS — Z8249 Family history of ischemic heart disease and other diseases of the circulatory system: Secondary | ICD-10-CM

## 2022-10-14 ENCOUNTER — Ambulatory Visit (HOSPITAL_BASED_OUTPATIENT_CLINIC_OR_DEPARTMENT_OTHER)
Admission: RE | Admit: 2022-10-14 | Discharge: 2022-10-14 | Disposition: A | Discharge status: Home / Self Care | Payer: Self-pay | Source: Ambulatory Visit | Attending: Internal Medicine | Admitting: Internal Medicine

## 2022-10-14 DIAGNOSIS — Z8249 Family history of ischemic heart disease and other diseases of the circulatory system: Secondary | ICD-10-CM | POA: Insufficient documentation

## 2023-08-23 ENCOUNTER — Other Ambulatory Visit: Payer: Self-pay | Admitting: Internal Medicine

## 2023-08-23 DIAGNOSIS — Z87891 Personal history of nicotine dependence: Secondary | ICD-10-CM

## 2023-09-07 ENCOUNTER — Other Ambulatory Visit: Payer: Self-pay

## 2023-09-07 ENCOUNTER — Ambulatory Visit (HOSPITAL_BASED_OUTPATIENT_CLINIC_OR_DEPARTMENT_OTHER)
Admission: RE | Admit: 2023-09-07 | Discharge: 2023-09-07 | Disposition: A | Discharge status: Home / Self Care | Payer: PPO | Source: Ambulatory Visit | Attending: Internal Medicine | Admitting: Internal Medicine

## 2023-09-07 ENCOUNTER — Other Ambulatory Visit: Payer: Self-pay | Admitting: Internal Medicine

## 2023-09-07 DIAGNOSIS — Z87891 Personal history of nicotine dependence: Secondary | ICD-10-CM

## 2023-09-07 DIAGNOSIS — R030 Elevated blood-pressure reading, without diagnosis of hypertension: Secondary | ICD-10-CM | POA: Diagnosis not present

## 2023-09-07 DIAGNOSIS — Z136 Encounter for screening for cardiovascular disorders: Secondary | ICD-10-CM | POA: Diagnosis not present

## 2023-09-07 DIAGNOSIS — Z125 Encounter for screening for malignant neoplasm of prostate: Secondary | ICD-10-CM | POA: Diagnosis not present

## 2023-09-07 DIAGNOSIS — Z0000 Encounter for general adult medical examination without abnormal findings: Secondary | ICD-10-CM | POA: Diagnosis not present

## 2023-09-11 DIAGNOSIS — L408 Other psoriasis: Secondary | ICD-10-CM | POA: Diagnosis not present

## 2023-09-11 DIAGNOSIS — N529 Male erectile dysfunction, unspecified: Secondary | ICD-10-CM | POA: Diagnosis not present

## 2023-09-11 DIAGNOSIS — G4762 Sleep related leg cramps: Secondary | ICD-10-CM | POA: Diagnosis not present

## 2023-09-11 DIAGNOSIS — Z0000 Encounter for general adult medical examination without abnormal findings: Secondary | ICD-10-CM | POA: Diagnosis not present

## 2023-09-11 DIAGNOSIS — Z202 Contact with and (suspected) exposure to infections with a predominantly sexual mode of transmission: Secondary | ICD-10-CM | POA: Diagnosis not present

## 2023-09-11 DIAGNOSIS — I2510 Atherosclerotic heart disease of native coronary artery without angina pectoris: Secondary | ICD-10-CM | POA: Diagnosis not present

## 2023-09-11 DIAGNOSIS — Z23 Encounter for immunization: Secondary | ICD-10-CM | POA: Diagnosis not present

## 2023-09-11 DIAGNOSIS — F17210 Nicotine dependence, cigarettes, uncomplicated: Secondary | ICD-10-CM | POA: Diagnosis not present

## 2023-09-11 DIAGNOSIS — E8889 Other specified metabolic disorders: Secondary | ICD-10-CM | POA: Diagnosis not present

## 2023-09-11 DIAGNOSIS — M1A9XX0 Chronic gout, unspecified, without tophus (tophi): Secondary | ICD-10-CM | POA: Diagnosis not present

## 2023-09-11 DIAGNOSIS — L409 Psoriasis, unspecified: Secondary | ICD-10-CM | POA: Diagnosis not present

## 2023-09-11 DIAGNOSIS — R5383 Other fatigue: Secondary | ICD-10-CM | POA: Diagnosis not present

## 2023-09-12 ENCOUNTER — Encounter: Payer: Self-pay | Admitting: Internal Medicine

## 2023-09-15 DIAGNOSIS — R5383 Other fatigue: Secondary | ICD-10-CM | POA: Diagnosis not present

## 2023-09-15 DIAGNOSIS — R7401 Elevation of levels of liver transaminase levels: Secondary | ICD-10-CM | POA: Diagnosis not present

## 2023-09-15 DIAGNOSIS — N529 Male erectile dysfunction, unspecified: Secondary | ICD-10-CM | POA: Diagnosis not present

## 2023-09-19 ENCOUNTER — Other Ambulatory Visit: Payer: Self-pay | Admitting: Internal Medicine

## 2023-09-19 DIAGNOSIS — F17210 Nicotine dependence, cigarettes, uncomplicated: Secondary | ICD-10-CM

## 2023-09-20 LAB — COLOGUARD

## 2023-09-20 LAB — EXTERNAL GENERIC LAB PROCEDURE

## 2023-09-24 DIAGNOSIS — Z1212 Encounter for screening for malignant neoplasm of rectum: Secondary | ICD-10-CM | POA: Diagnosis not present

## 2023-09-24 DIAGNOSIS — Z1211 Encounter for screening for malignant neoplasm of colon: Secondary | ICD-10-CM | POA: Diagnosis not present

## 2023-10-01 LAB — COLOGUARD: COLOGUARD: NEGATIVE

## 2023-10-01 LAB — EXTERNAL GENERIC LAB PROCEDURE: COLOGUARD: NEGATIVE

## 2023-10-03 DIAGNOSIS — Z8249 Family history of ischemic heart disease and other diseases of the circulatory system: Secondary | ICD-10-CM | POA: Diagnosis not present

## 2023-10-03 DIAGNOSIS — R931 Abnormal findings on diagnostic imaging of heart and coronary circulation: Secondary | ICD-10-CM | POA: Diagnosis not present

## 2023-10-03 DIAGNOSIS — E7800 Pure hypercholesterolemia, unspecified: Secondary | ICD-10-CM | POA: Diagnosis not present

## 2023-10-03 DIAGNOSIS — F17210 Nicotine dependence, cigarettes, uncomplicated: Secondary | ICD-10-CM | POA: Diagnosis not present

## 2023-10-03 DIAGNOSIS — E7439 Other disorders of intestinal carbohydrate absorption: Secondary | ICD-10-CM | POA: Diagnosis not present

## 2023-10-03 DIAGNOSIS — I2510 Atherosclerotic heart disease of native coronary artery without angina pectoris: Secondary | ICD-10-CM | POA: Diagnosis not present

## 2023-10-11 ENCOUNTER — Other Ambulatory Visit: Payer: Self-pay | Admitting: Internal Medicine

## 2023-10-11 DIAGNOSIS — I77810 Thoracic aortic ectasia: Secondary | ICD-10-CM

## 2023-10-12 DIAGNOSIS — D235 Other benign neoplasm of skin of trunk: Secondary | ICD-10-CM | POA: Diagnosis not present

## 2023-10-12 DIAGNOSIS — L814 Other melanin hyperpigmentation: Secondary | ICD-10-CM | POA: Diagnosis not present

## 2023-10-12 DIAGNOSIS — L579 Skin changes due to chronic exposure to nonionizing radiation, unspecified: Secondary | ICD-10-CM | POA: Diagnosis not present

## 2023-10-12 DIAGNOSIS — D225 Melanocytic nevi of trunk: Secondary | ICD-10-CM | POA: Diagnosis not present

## 2023-10-12 DIAGNOSIS — L821 Other seborrheic keratosis: Secondary | ICD-10-CM | POA: Diagnosis not present

## 2023-10-13 DIAGNOSIS — H40013 Open angle with borderline findings, low risk, bilateral: Secondary | ICD-10-CM | POA: Diagnosis not present

## 2023-10-13 DIAGNOSIS — H524 Presbyopia: Secondary | ICD-10-CM | POA: Diagnosis not present

## 2023-10-13 DIAGNOSIS — Z961 Presence of intraocular lens: Secondary | ICD-10-CM | POA: Diagnosis not present

## 2023-10-13 DIAGNOSIS — H25812 Combined forms of age-related cataract, left eye: Secondary | ICD-10-CM | POA: Diagnosis not present

## 2023-10-14 ENCOUNTER — Ambulatory Visit (HOSPITAL_BASED_OUTPATIENT_CLINIC_OR_DEPARTMENT_OTHER)
Admission: RE | Admit: 2023-10-14 | Discharge: 2023-10-14 | Disposition: A | Discharge status: Home / Self Care | Source: Ambulatory Visit | Attending: Internal Medicine | Admitting: Internal Medicine

## 2023-10-14 DIAGNOSIS — Z87891 Personal history of nicotine dependence: Secondary | ICD-10-CM | POA: Diagnosis not present

## 2023-10-14 DIAGNOSIS — F17210 Nicotine dependence, cigarettes, uncomplicated: Secondary | ICD-10-CM | POA: Insufficient documentation

## 2023-11-17 DIAGNOSIS — E7439 Other disorders of intestinal carbohydrate absorption: Secondary | ICD-10-CM | POA: Diagnosis not present

## 2023-11-17 DIAGNOSIS — E7800 Pure hypercholesterolemia, unspecified: Secondary | ICD-10-CM | POA: Diagnosis not present

## 2023-12-05 DIAGNOSIS — M25552 Pain in left hip: Secondary | ICD-10-CM | POA: Diagnosis not present

## 2023-12-05 DIAGNOSIS — M25532 Pain in left wrist: Secondary | ICD-10-CM | POA: Diagnosis not present

## 2023-12-14 DIAGNOSIS — M25532 Pain in left wrist: Secondary | ICD-10-CM | POA: Diagnosis not present

## 2023-12-28 DIAGNOSIS — M25552 Pain in left hip: Secondary | ICD-10-CM | POA: Diagnosis not present

## 2023-12-28 DIAGNOSIS — M25532 Pain in left wrist: Secondary | ICD-10-CM | POA: Diagnosis not present

## 2024-02-01 DIAGNOSIS — M25532 Pain in left wrist: Secondary | ICD-10-CM | POA: Diagnosis not present

## 2024-02-02 DIAGNOSIS — I2510 Atherosclerotic heart disease of native coronary artery without angina pectoris: Secondary | ICD-10-CM | POA: Diagnosis not present

## 2024-02-02 DIAGNOSIS — E7800 Pure hypercholesterolemia, unspecified: Secondary | ICD-10-CM | POA: Diagnosis not present

## 2024-02-02 DIAGNOSIS — E875 Hyperkalemia: Secondary | ICD-10-CM | POA: Diagnosis not present

## 2024-02-02 DIAGNOSIS — E7439 Other disorders of intestinal carbohydrate absorption: Secondary | ICD-10-CM | POA: Diagnosis not present

## 2024-02-08 ENCOUNTER — Other Ambulatory Visit (HOSPITAL_COMMUNITY): Payer: Self-pay | Admitting: Internal Medicine

## 2024-02-08 DIAGNOSIS — R7989 Other specified abnormal findings of blood chemistry: Secondary | ICD-10-CM

## 2024-02-22 ENCOUNTER — Ambulatory Visit (HOSPITAL_BASED_OUTPATIENT_CLINIC_OR_DEPARTMENT_OTHER)
Admission: RE | Admit: 2024-02-22 | Discharge: 2024-02-22 | Disposition: A | Discharge status: Home / Self Care | Source: Ambulatory Visit | Attending: Internal Medicine | Admitting: Internal Medicine

## 2024-02-22 DIAGNOSIS — R7989 Other specified abnormal findings of blood chemistry: Secondary | ICD-10-CM | POA: Insufficient documentation

## 2024-02-22 DIAGNOSIS — R748 Abnormal levels of other serum enzymes: Secondary | ICD-10-CM | POA: Diagnosis not present

## 2024-02-22 DIAGNOSIS — N281 Cyst of kidney, acquired: Secondary | ICD-10-CM | POA: Diagnosis not present

## 2024-02-23 ENCOUNTER — Other Ambulatory Visit: Payer: Self-pay | Admitting: Medical Genetics

## 2024-04-02 DIAGNOSIS — Z820 Family history of epilepsy and other diseases of the nervous system: Secondary | ICD-10-CM | POA: Diagnosis not present

## 2024-04-02 DIAGNOSIS — Z23 Encounter for immunization: Secondary | ICD-10-CM | POA: Diagnosis not present

## 2024-04-02 DIAGNOSIS — Z8719 Personal history of other diseases of the digestive system: Secondary | ICD-10-CM | POA: Diagnosis not present

## 2024-05-03 ENCOUNTER — Encounter: Payer: Self-pay | Admitting: Gastroenterology

## 2024-05-15 ENCOUNTER — Other Ambulatory Visit: Payer: Self-pay | Admitting: Medical Genetics

## 2024-05-15 DIAGNOSIS — Z006 Encounter for examination for normal comparison and control in clinical research program: Secondary | ICD-10-CM

## 2024-06-07 LAB — GENECONNECT MOLECULAR SCREEN: Genetic Analysis Overall Interpretation: NEGATIVE

## 2024-06-25 ENCOUNTER — Encounter: Payer: Self-pay | Admitting: Gastroenterology

## 2024-06-25 ENCOUNTER — Ambulatory Visit: Admitting: Gastroenterology

## 2024-06-25 VITALS — BP 110/74 | HR 76 | Wt 153.4 lb

## 2024-06-25 DIAGNOSIS — Z1211 Encounter for screening for malignant neoplasm of colon: Secondary | ICD-10-CM

## 2024-06-25 DIAGNOSIS — K2270 Barrett's esophagus without dysplasia: Secondary | ICD-10-CM

## 2024-06-25 DIAGNOSIS — R748 Abnormal levels of other serum enzymes: Secondary | ICD-10-CM | POA: Diagnosis not present

## 2024-06-25 NOTE — Progress Notes (Signed)
 HPI : Joe Ferguson is a 63 y.o. male who is referred to us  by Clarice Nottingham, MD for repeat upper endoscopy.  The patient states that he underwent an EGD and colonoscopy over 15 years ago in our office with Dr. Aneita.  He believes the colonoscopy was normal, but the upper endoscopy showed evidence of Barrett's.  I do not have the procedure reports for review, but I see a pathology report from 2009 that shows evidence of intestinal metaplasia of the esophagus, consistent with Barrett's.  No dysplasia present.  The patient denies any significant chronic GERD history.  He does not take any medications for reflux.  The indications for the upper endoscopy in 2009 are listed as dysphagia. He denies any dysphagia symptoms or any other upper GI symptoms currently.  He has not undergone any other upper endoscopies since that initial 1 in 2009.  He has been screening for colon cancer with a Cologuard.  He had a negative Cologuard in March of this year.  He denies any chronic lower GI symptoms such as abdominal pain, constipation, diarrhea or blood in the stool.  He reports his mother had some sort of metastatic malignancy of the upper GI tract of unknown origin.  Otherwise no family history of GI malignancy.  He is a non-smoker and does not drink any alcohol.  He reports that he has had elevated liver enzymes in the past, but these have since normalized.  There is a right upper quadrant ultrasound in August of this year that showed a normal-appearing liver.   Right upper quadrant ultrasound August 2025 FINDINGS: Gallbladder:   No gallstones or wall thickening visualized. No sonographic Murphy sign noted by sonographer.   Common bile duct:   Diameter: 3.7 mm.   Liver:   No focal lesion identified. Within normal limits in parenchymal echogenicity. Portal vein is patent on color Doppler imaging with normal direction of blood flow towards the liver.   Other: Incidental finding of a 1.9  cm simple cyst in the right kidney. No follow-up is recommended.   IMPRESSION: No acute abnormality identified.  No past medical history on file.   Past Surgical History:  Procedure Laterality Date   HERNIA REPAIR    Multiple orthopedic surgeries   Family History  Problem Relation Age of Onset   Stomach cancer Mother    Heart disease Father    Social History   Tobacco Use   Smoking status: Light Smoker    Types: Cigars   Smokeless tobacco: Never   Tobacco comments:    occansional  Substance Use Topics   Alcohol use: Yes   Drug use: No   Current Outpatient Medications  Medication Sig Dispense Refill   REPATHA SURECLICK 140 MG/ML SOAJ Inject 140 mg into the skin every 14 (fourteen) days.     sildenafil (REVATIO) 20 MG tablet Take 40-100 mg by mouth daily as needed.     No current facility-administered medications for this visit.   No Known Allergies   Review of Systems: All systems reviewed and negative except where noted in HPI.    No results found.  Physical Exam: BP 110/74   Pulse 76   Wt 153 lb 6.4 oz (69.6 kg)   BMI 24.03 kg/m  Constitutional: Pleasant,well-developed, Caucasian male in no acute distress. HEENT: Normocephalic and atraumatic. Conjunctivae are normal. No scleral icterus. Neck supple.  Cardiovascular: Normal rate, regular rhythm.  Pulmonary/chest: Effort normal and breath sounds normal. No wheezing, rales or rhonchi. Abdominal: Soft,  nondistended, nontender. Bowel sounds active throughout. There are no masses palpable. No hepatomegaly. Extremities: no edema Lymphadenopathy: No cervical adenopathy noted. Neurological: Alert and oriented to person place and time. Skin: Skin is warm and dry. No rashes noted. Psychiatric: Normal mood and affect. Behavior is normal.  CBC    Component Value Date/Time   WBC 6.3 10/08/2018 1509   RBC 4.40 10/08/2018 1509   HGB 14.8 10/08/2018 1509   HCT 43.0 10/08/2018 1509   PLT 214 10/08/2018 1509    MCV 97.7 10/08/2018 1509   MCH 33.6 10/08/2018 1509   MCHC 34.4 10/08/2018 1509   RDW 13.2 10/08/2018 1509   LYMPHSABS 1.7 10/08/2018 1509   MONOABS 0.6 10/08/2018 1509   EOSABS 0.1 10/08/2018 1509   BASOSABS 0.0 10/08/2018 1509    CMP     Component Value Date/Time   NA 134 (L) 10/08/2018 1509   K 3.9 10/08/2018 1509   CL 100 10/08/2018 1509   CO2 16 (L) 10/08/2018 1509   GLUCOSE 76 10/08/2018 1509   BUN 9 10/08/2018 1509   CREATININE 0.85 10/08/2018 1509   CALCIUM 8.0 (L) 10/08/2018 1509   PROT 6.9 10/08/2018 1509   ALBUMIN 3.8 10/08/2018 1509   AST 78 (H) 10/08/2018 1509   ALT 45 (H) 10/08/2018 1509   ALKPHOS 74 10/08/2018 1509   BILITOT 0.4 10/08/2018 1509   GFRNONAA >60 10/08/2018 1509   GFRAA >60 10/08/2018 1509       Latest Ref Rng & Units 10/08/2018    3:09 PM 01/24/2011   10:50 AM  CBC EXTENDED  WBC 4.0 - 10.5 K/uL 6.3    RBC 4.22 - 5.81 MIL/uL 4.40    Hemoglobin 13.0 - 17.0 g/dL 85.1  84.6   HCT 60.9 - 52.0 % 43.0    Platelets 150 - 400 K/uL 214    NEUT# 1.7 - 7.7 K/uL 3.9    Lymph# 0.7 - 4.0 K/uL 1.7        ASSESSMENT AND PLAN:   63 year old male with a remote history of Barrett's esophagus (details unknown) from 2009 with no surveillance endoscopy since then.  He denies any chronic upper GI symptoms.  He has a family history of upper GI cancer (?Stomach cancer?), but no smoking history.  Will plan to repeat upper endoscopy for Barrett's surveillance.  If no Barrett's mucosa noted on this EGD, no need for further surveillance would be needed.  History of Barrett's esophagus - Surveillance EGD - Further recommendations will be based on endoscopic findings  Colon cancer screening - Repeat Cologuard 2028  Elevated liver enzymes - Normalized per patient - Ultrasound with normal-appearing liver making likelihood of chronic liver disease very low.  No additional evaluation recommended - Follow-up with PCP  The details, risks (including bleeding,  perforation, infection, missed lesions, medication reactions and possible hospitalization or surgery if complications occur), benefits, and alternatives to EGD with possible biopsy and possible dilation were discussed with the patient and he consents to proceed.   Kimberley Speece E. Stacia, MD [facility]  I spent a total of 30 minutes reviewing the patient's medical record, interviewing and examining the patient, discussing his diagnosis and management of his condition going forward, and documenting in the medical record  Clarice Nottingham, MD

## 2024-06-25 NOTE — Patient Instructions (Signed)
 You have been scheduled for an endoscopy. Please follow written instructions given to you at your visit today.  If you use inhalers (even only as needed), please bring them with you on the day of your procedure.  If you take any of the following medications, they will need to be adjusted prior to your procedure:   DO NOT TAKE 7 DAYS PRIOR TO TEST- Trulicity (dulaglutide) Ozempic, Wegovy (semaglutide) Mounjaro, Zepbound (tirzepatide) Bydureon Bcise (exanatide extended release)  DO NOT TAKE 1 DAY PRIOR TO YOUR TEST Rybelsus (semaglutide) Adlyxin (lixisenatide) Victoza (liraglutide) Byetta (exanatide) _______________________________________________________________  _______________________________________________________  If your blood pressure at your visit was 140/90 or greater, please contact your primary care physician to follow up on this.  _______________________________________________________  If you are age 65 or older, your body mass index should be between 23-30. Your Body mass index is 24.03 kg/m. If this is out of the aforementioned range listed, please consider follow up with your Primary Care Provider.  If you are age 65 or younger, your body mass index should be between 19-25. Your Body mass index is 24.03 kg/m. If this is out of the aformentioned range listed, please consider follow up with your Primary Care Provider.   ________________________________________________________  The [facility] providers would like to encourage you to use MYCHART to communicate with providers for non-urgent requests or questions.  Due to long hold times on the telephone, sending your provider a message by [facility] may be a faster and more efficient way to get a response.  Please allow 48 business hours for a response.  Please remember that this is for non-urgent requests.  _______________________________________________________  [facility] is using a team-based approach to  care.  Your team is made up of your doctor and two to three APPS. Our APPS (Nurse Practitioners and Physician Assistants) work with your physician to ensure care continuity for you. They are fully qualified to address your health concerns and develop a treatment plan. They communicate directly with your gastroenterologist to care for you. Seeing the Advanced Practice Practitioners on your physician's team can help you by facilitating care more promptly, often allowing for earlier appointments, access to diagnostic testing, procedures, and other specialty referrals.

## 2024-06-27 ENCOUNTER — Telehealth: Payer: Self-pay | Admitting: Gastroenterology

## 2024-06-27 NOTE — Telephone Encounter (Signed)
 Spoke w pt about rescheduling EGD from 01/13 to 02/05. Pt in need of new prep instructions. Please advise.

## 2024-06-27 NOTE — Telephone Encounter (Signed)
 New prep instructions reflecting new EGD date of 08/22/24 sent via mychart

## 2024-07-30 ENCOUNTER — Encounter: Admitting: Gastroenterology

## 2024-08-22 ENCOUNTER — Encounter: Payer: Self-pay | Admitting: Gastroenterology

## 2024-08-22 ENCOUNTER — Ambulatory Visit: Admitting: Gastroenterology

## 2024-08-22 VITALS — BP 104/65 | HR 73 | Temp 97.9°F | Resp 22 | Ht 66.0 in | Wt 153.0 lb

## 2024-08-22 DIAGNOSIS — K2270 Barrett's esophagus without dysplasia: Secondary | ICD-10-CM

## 2024-08-22 DIAGNOSIS — Z1211 Encounter for screening for malignant neoplasm of colon: Secondary | ICD-10-CM

## 2024-08-22 MED ORDER — SODIUM CHLORIDE 0.9 % IV SOLN: 500.0000 mL | INTRAVENOUS | Status: DC

## 2024-08-22 MED ORDER — PANTOPRAZOLE SODIUM 20 MG PO TBEC: 20.0000 mg | DELAYED_RELEASE_TABLET | Freq: Every day | ORAL | 3 refills | Status: AC

## 2024-08-22 NOTE — Progress Notes (Signed)
 Called to room to assist during endoscopic procedure.  Patient ID and intended procedure confirmed with present staff. Received instructions for my participation in the procedure from the performing physician.

## 2024-08-22 NOTE — Progress Notes (Signed)
 Pt's states no medical or surgical changes since previsit or office visit.

## 2024-08-22 NOTE — Op Note (Signed)
 [facility] Patient Name: Joe Ferguson Procedure Date: 08/22/2024 12:05 PM MRN: [id number] Endoscopist: Glendia E. Stacia , MD, [id number] Age: 64 Referring MD:  Date of Birth: [birth year] Gender: Male Account #: [id number] Procedure:                Upper GI endoscopy Indications:              Surveillance for malignancy due to personal history                            of Barrett's esophagus Medicines:                Monitored Anesthesia Care Procedure:                Pre-Anesthesia Assessment:                           - Prior to the procedure, a History and Physical                            was performed, and patient medications and                            allergies were reviewed. The patient's tolerance of                            previous anesthesia was also reviewed. The risks                            and benefits of the procedure and the sedation                            options and risks were discussed with the patient.                            All questions were answered, and informed consent                            was obtained. Prior Anticoagulants: The patient has                            taken no anticoagulant or antiplatelet agents. ASA                            Grade Assessment: II - A patient with mild systemic                            disease. After reviewing the risks and benefits,                            the patient was deemed in satisfactory condition to                            undergo the procedure.  After obtaining informed consent, the endoscope was                            passed under direct vision. Throughout the                            procedure, the patient's blood pressure, pulse, and                            oxygen saturations were monitored continuously. The                            GIF [id number] #[id number] was introduced through the                            mouth, and advanced to the  second part of duodenum.                            The upper GI endoscopy was accomplished without                            difficulty. The patient tolerated the procedure                            well. Scope In: Scope Out: Findings:                 The examined portions of the nasopharynx,                            oropharynx and larynx were normal.                           There were esophageal mucosal changes suspicious                            for short-segment Barrett's esophagus, classified                            as Barrett's stage C0-M2 per Prague criteria                            present in the lower third of the esophagus. The                            maximum longitudinal extent of these mucosal                            changes was 2 cm in length. Mucosa was biopsied                            with a cold forceps for histology at intervals of 1                            cm  from 36 to 38 cm from the incisors. A total of 3                            specimen bottles were sent to pathology. Estimated                            blood loss was minimal.                           The gastroesophageal flap valve was visualized                            endoscopically and classified as Hill Grade III                            (minimal fold, loose to endoscope, hiatal hernia                            likely).                           The exam of the esophagus was otherwise normal.                           A single small erosion with no bleeding and no                            stigmata of recent bleeding was found in the                            gastric body. Biopsies were taken with a cold                            forceps for Helicobacter pylori testing. Estimated                            blood loss was minimal.                           The exam of the stomach was otherwise normal.                           A few erosions without bleeding were found in  the                            duodenal bulb. Biopsies were taken with a cold                            forceps for histology. Estimated blood loss was                            minimal.  The exam of the duodenum was otherwise normal. Complications:            No immediate complications. Estimated Blood Loss:     Estimated blood loss was minimal. Impression:               - The examined portions of the nasopharynx,                            oropharynx and larynx were normal.                           - Esophageal mucosal changes suspicious for                            short-segment Barrett's esophagus, classified as                            Barrett's stage C0-M2 per Prague criteria. Biopsied.                           - Gastroesophageal flap valve classified as Hill                            Grade III (minimal fold, loose to endoscope, hiatal                            hernia likely).                           - Erosive gastropathy with no bleeding and no                            stigmata of recent bleeding. Biopsied.                           - Duodenal erosions without bleeding. Biopsied. Recommendation:           - Patient has a contact number available for                            emergencies. The signs and symptoms of potential                            delayed complications were discussed with the                            patient. Return to normal activities tomorrow.                            Written discharge instructions were provided to the                            patient.                           - Resume previous diet.                           -  Continue present medications.                           - Await pathology results.                           - Repeat upper endoscopy (date not yet determined)                            for surveillance of Barrett's esophagus.                           - Start a daily low dose PPI such as  pantoprazole                             20 mg PO daily indefinitely, given presence of                            Barrett's esophagus.                           - Avoid NSAIDs given evidence of erosive                            gastropathy/duodenopathy. Rahcel Shutes E. Stacia, MD 08/22/2024 12:47:45 PM This report has been signed electronically.

## 2024-08-22 NOTE — Progress Notes (Signed)
 Vss nad trans to pacu

## 2024-08-22 NOTE — Progress Notes (Signed)
 [facility] History and Physical   Primary Care Physician:  Patient, No Pcp Per   Reason for Procedure:   Barrett's esophagus  Plan:    EGD   HPI: Joe Ferguson is a 64 y.o. male with history of Barrett's esophagus undergoing surveillance EGD.  Last EGD 2009.  Currently no GERD symptoms and not on any PPI.  Family history of metastatic GI malignancy of unknown etiology (mother).     The patient was provided an opportunity to ask questions and all were answered. The patient agreed with the plan    History reviewed. No pertinent past medical history.  Past Surgical History:  Procedure Laterality Date   HERNIA REPAIR      Prior to Admission medications  Medication Sig Start Date End Date Taking? Authorizing Provider  MULTIPLE VITAMIN PO Take by mouth.   Yes [provider]  REPATHA SURECLICK 140 MG/ML SOAJ Inject 140 mg into the skin every 14 (fourteen) days. 06/24/24   [provider]  sildenafil (REVATIO) 20 MG tablet Take 40-100 mg by mouth daily as needed. 02/16/24   [provider]    Current Outpatient Medications  Medication Sig Dispense Refill   MULTIPLE VITAMIN PO Take by mouth.     REPATHA SURECLICK 140 MG/ML SOAJ Inject 140 mg into the skin every 14 (fourteen) days.     sildenafil (REVATIO) 20 MG tablet Take 40-100 mg by mouth daily as needed.     Current Facility-Administered Medications  Medication Dose Route Frequency Provider Last Rate Last Admin   0.9 %  sodium chloride  infusion  500 mL Intravenous Continuous Stacia Glendia BRAVO, MD        Allergies as of 08/22/2024   (No Known Allergies)    Family History  Problem Relation Age of Onset   Stomach cancer Mother    Heart disease Father     Social History   Socioeconomic History   Marital status: Married    Spouse name: Not on file   Number of children: 4   Years of education: Not on file   Highest education level: Not on file  Occupational History   Not on file   Tobacco Use   Smoking status: Former    Types: Cigars   Smokeless tobacco: Current    Types: Snuff   Tobacco comments:    occansional  Substance and Sexual Activity   Alcohol use: Not Currently   Drug use: No   Sexual activity: Not on file  Other Topics Concern   Not on file  Social History Narrative   Not on file   Social Drivers of Health   Tobacco Use: High Risk (08/22/2024)   Patient History    Smoking Tobacco Use: Former    Smokeless Tobacco Use: Current    Passive Exposure: Not on Actuary Strain: Not on file  Food Insecurity: Not on file  Transportation Needs: Not on file  Physical Activity: Not on file  Stress: Not on file  Social Connections: Not on file  Intimate Partner Violence: Not on file  Depression (EYV7-0): Not on file  Alcohol Screen: Not on file  Housing: Not on file  Utilities: Not on file  Health Literacy: Not on file    Review of Systems:  All other review of systems negative except as mentioned in the HPI.  Physical Exam: Vital signs BP 139/80   Pulse 60   Temp 97.9 F (36.6 C)   Ht 5' 6 (1.676 m)  Wt 153 lb (69.4 kg)   SpO2 99%   BMI 24.69 kg/m   General:   Alert,  Well-developed, well-nourished, pleasant and cooperative in NAD Airway:  Mallampati 1 Lungs:  Clear throughout to auscultation.   Heart:  Regular rate and rhythm; no murmurs, clicks, rubs,  or gallops. Abdomen:  Soft, nontender and nondistended. Normal bowel sounds.   Neuro/Psych:  Normal mood and affect. A and O x 3   Loyce Flaming E. Stacia, MD [facility]

## 2024-08-22 NOTE — Patient Instructions (Addendum)
-   Resume previous diet. - Continue present medications. - Await pathology results. - Repeat upper endoscopy (date not yet determined)     for surveillance of Barrett's esophagus. - Start a daily low dose PPI such as pantoprazole     20 mg PO daily indefinitely, given presence of    Barrett's esophagus. - Avoid NSAIDs given evidence of erosive     gastropathy/duodenopathy.  YOU HAD AN ENDOSCOPIC PROCEDURE TODAY AT THE [facility]:   Refer to the procedure report that was given to you for any specific questions about what was found during the examination.  If the procedure report does not answer your questions, please call your gastroenterologist to clarify.  If you requested that your care partner not be given the details of your procedure findings, then the procedure report has been included in a sealed envelope for you to review at your convenience later.  YOU SHOULD EXPECT: Some feelings of bloating in the abdomen. Passage of more gas than usual.  Walking can help get rid of the air that was put into your GI tract during the procedure and reduce the bloating. If you had a lower endoscopy (such as a colonoscopy or flexible sigmoidoscopy) you may notice spotting of blood in your stool or on the toilet paper. If you underwent a bowel prep for your procedure, you may not have a normal bowel movement for a few days.  Please Note:  You might notice some irritation and congestion in your nose or some drainage.  This is from the oxygen used during your procedure.  There is no need for concern and it should clear up in a day or so.  SYMPTOMS TO REPORT IMMEDIATELY:  Following upper endoscopy (EGD)  Vomiting of blood or coffee ground material  New chest pain or pain under the shoulder blades  Painful or persistently difficult swallowing  New shortness of breath  Fever of 100F or higher  Black, tarry-looking stools  For urgent or emergent issues, a gastroenterologist can be reached at  any hour by calling [phone]. Do not use MyChart messaging for urgent concerns.    DIET:  We do recommend a small meal at first, but then you may proceed to your regular diet.  Drink plenty of fluids but you should avoid alcoholic beverages for 24 hours.  ACTIVITY:  You should plan to take it easy for the rest of today and you should NOT DRIVE or use heavy machinery until tomorrow (because of the sedation medicines used during the test).    FOLLOW UP: Our staff will call the number listed on your records the next business day following your procedure.  We will call around 7:15- 8:00 am to check on you and address any questions or concerns that you may have regarding the information given to you following your procedure. If we do not reach you, we will leave a message.     If any biopsies were taken you will be contacted by phone or by letter within the next 1-3 weeks.  Please call us  at [phone] if you have not heard about the biopsies in 3 weeks.    SIGNATURES/CONFIDENTIALITY: You and/or your care partner have signed paperwork which will be entered into your electronic medical record.  These signatures attest to the fact that that the information above on your After Visit Summary has been reviewed and is understood.  Full responsibility of the confidentiality of this discharge information lies with you and/or your care-partner.

## 2024-08-23 ENCOUNTER — Telehealth: Payer: Self-pay

## 2024-08-23 NOTE — Telephone Encounter (Signed)
 LMOM
